# Patient Record
Sex: Female | Born: 1955 | Race: White | Hispanic: No | Smoking: Current every day smoker
Health system: Southern US, Community
[De-identification: ages and names within clinical notes are randomized; demographics above are authoritative.]

## PROBLEM LIST (undated history)

## (undated) DIAGNOSIS — G5600 Carpal tunnel syndrome, unspecified upper limb: Secondary | ICD-10-CM

## (undated) DIAGNOSIS — E7800 Pure hypercholesterolemia, unspecified: Secondary | ICD-10-CM

## (undated) DIAGNOSIS — M1990 Unspecified osteoarthritis, unspecified site: Secondary | ICD-10-CM

## (undated) DIAGNOSIS — E559 Vitamin D deficiency, unspecified: Secondary | ICD-10-CM

## (undated) DIAGNOSIS — K760 Fatty (change of) liver, not elsewhere classified: Secondary | ICD-10-CM

## (undated) DIAGNOSIS — M719 Bursopathy, unspecified: Secondary | ICD-10-CM

## (undated) DIAGNOSIS — F32A Depression, unspecified: Secondary | ICD-10-CM

## (undated) DIAGNOSIS — F419 Anxiety disorder, unspecified: Secondary | ICD-10-CM

## (undated) DIAGNOSIS — Z8619 Personal history of other infectious and parasitic diseases: Secondary | ICD-10-CM

## (undated) DIAGNOSIS — K649 Unspecified hemorrhoids: Secondary | ICD-10-CM

## (undated) DIAGNOSIS — M6530 Trigger finger, unspecified finger: Secondary | ICD-10-CM

## (undated) DIAGNOSIS — T7840XA Allergy, unspecified, initial encounter: Secondary | ICD-10-CM

## (undated) DIAGNOSIS — J309 Allergic rhinitis, unspecified: Secondary | ICD-10-CM

## (undated) DIAGNOSIS — N809 Endometriosis, unspecified: Secondary | ICD-10-CM

## (undated) DIAGNOSIS — M810 Age-related osteoporosis without current pathological fracture: Secondary | ICD-10-CM

## (undated) DIAGNOSIS — G4730 Sleep apnea, unspecified: Secondary | ICD-10-CM

## (undated) DIAGNOSIS — Z87442 Personal history of urinary calculi: Secondary | ICD-10-CM

## (undated) DIAGNOSIS — I10 Essential (primary) hypertension: Secondary | ICD-10-CM

## (undated) DIAGNOSIS — F1010 Alcohol abuse, uncomplicated: Secondary | ICD-10-CM

## (undated) DIAGNOSIS — J329 Chronic sinusitis, unspecified: Secondary | ICD-10-CM

## (undated) HISTORY — PX: BREAST BIOPSY: SHX20

## (undated) HISTORY — PX: OTHER SURGICAL HISTORY: SHX169

## (undated) HISTORY — PX: COLONOSCOPY: SHX174

## (undated) HISTORY — PX: ABDOMINAL HYSTERECTOMY: SHX81

## (undated) HISTORY — PX: TONSILLECTOMY AND ADENOIDECTOMY: SUR1326

---
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
## 1996-09-27 HISTORY — PX: ABDOMINAL HYSTERECTOMY: SHX81

## 2004-10-09 ENCOUNTER — Ambulatory Visit: Payer: Self-pay | Admitting: Family Medicine

## 2005-09-27 DIAGNOSIS — H25019 Cortical age-related cataract, unspecified eye: Secondary | ICD-10-CM

## 2005-09-27 HISTORY — DX: Cortical age-related cataract, unspecified eye: H25.019

## 2005-11-11 ENCOUNTER — Ambulatory Visit: Payer: Self-pay | Admitting: Family Medicine

## 2005-11-19 ENCOUNTER — Ambulatory Visit: Payer: Self-pay | Admitting: Family Medicine

## 2006-01-03 ENCOUNTER — Ambulatory Visit: Payer: Self-pay | Admitting: General Surgery

## 2006-02-03 ENCOUNTER — Ambulatory Visit: Payer: Self-pay | Admitting: Gastroenterology

## 2007-05-10 ENCOUNTER — Ambulatory Visit: Payer: Self-pay

## 2008-06-06 ENCOUNTER — Ambulatory Visit: Payer: Self-pay | Admitting: Family Medicine

## 2009-06-09 ENCOUNTER — Ambulatory Visit: Payer: Self-pay | Admitting: Family Medicine

## 2010-07-21 ENCOUNTER — Ambulatory Visit: Payer: Self-pay | Admitting: Family Medicine

## 2010-07-28 ENCOUNTER — Ambulatory Visit: Payer: Self-pay | Admitting: Unknown Physician Specialty

## 2010-08-05 ENCOUNTER — Ambulatory Visit: Payer: Self-pay | Admitting: Family Medicine

## 2011-08-18 ENCOUNTER — Ambulatory Visit: Payer: Self-pay | Admitting: Family Medicine

## 2011-08-29 ENCOUNTER — Ambulatory Visit: Payer: Self-pay | Admitting: Internal Medicine

## 2011-10-17 ENCOUNTER — Ambulatory Visit: Payer: Self-pay

## 2012-08-22 ENCOUNTER — Ambulatory Visit: Payer: Self-pay | Admitting: Family Medicine

## 2012-08-29 ENCOUNTER — Ambulatory Visit: Payer: Self-pay | Admitting: Family Medicine

## 2012-09-12 ENCOUNTER — Ambulatory Visit: Payer: Self-pay | Admitting: Surgery

## 2013-09-07 ENCOUNTER — Ambulatory Visit: Payer: Self-pay | Admitting: Family Medicine

## 2013-11-23 IMAGING — CR DG CHEST 2V
1 series · 2 of 2 positions shown · non-contrast
Comparison: none

REASON FOR EXAM: cough
COMMENTS:

PROCEDURE:     MDR - MDR CHEST PA(OR AP) AND LATERAL  - October 17, 2011 [DATE]
RESULT:     The lungs are clear. The cardiac silhouette and visualized bony
skeleton are unremarkable.

[Series 1: pa · 0.17mm/px · 2 of 2 slices shown]
[im 1/2]
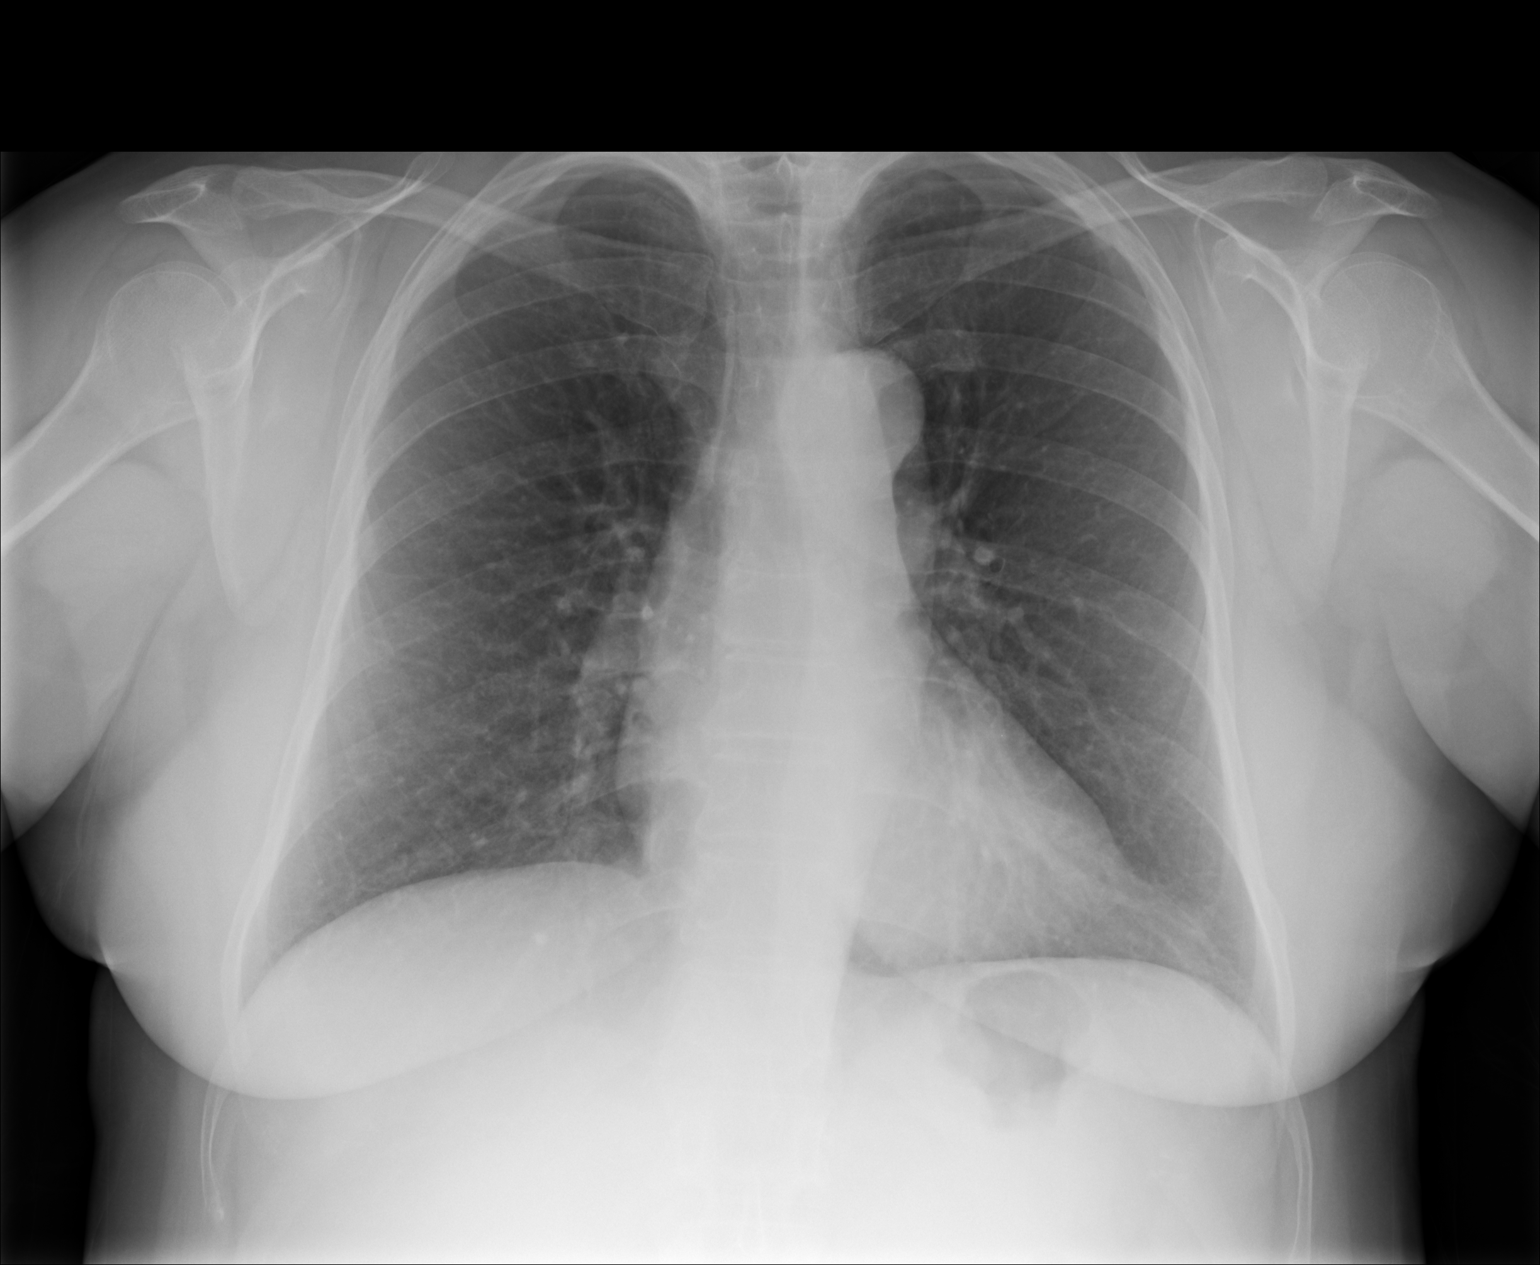
[im 2/2]
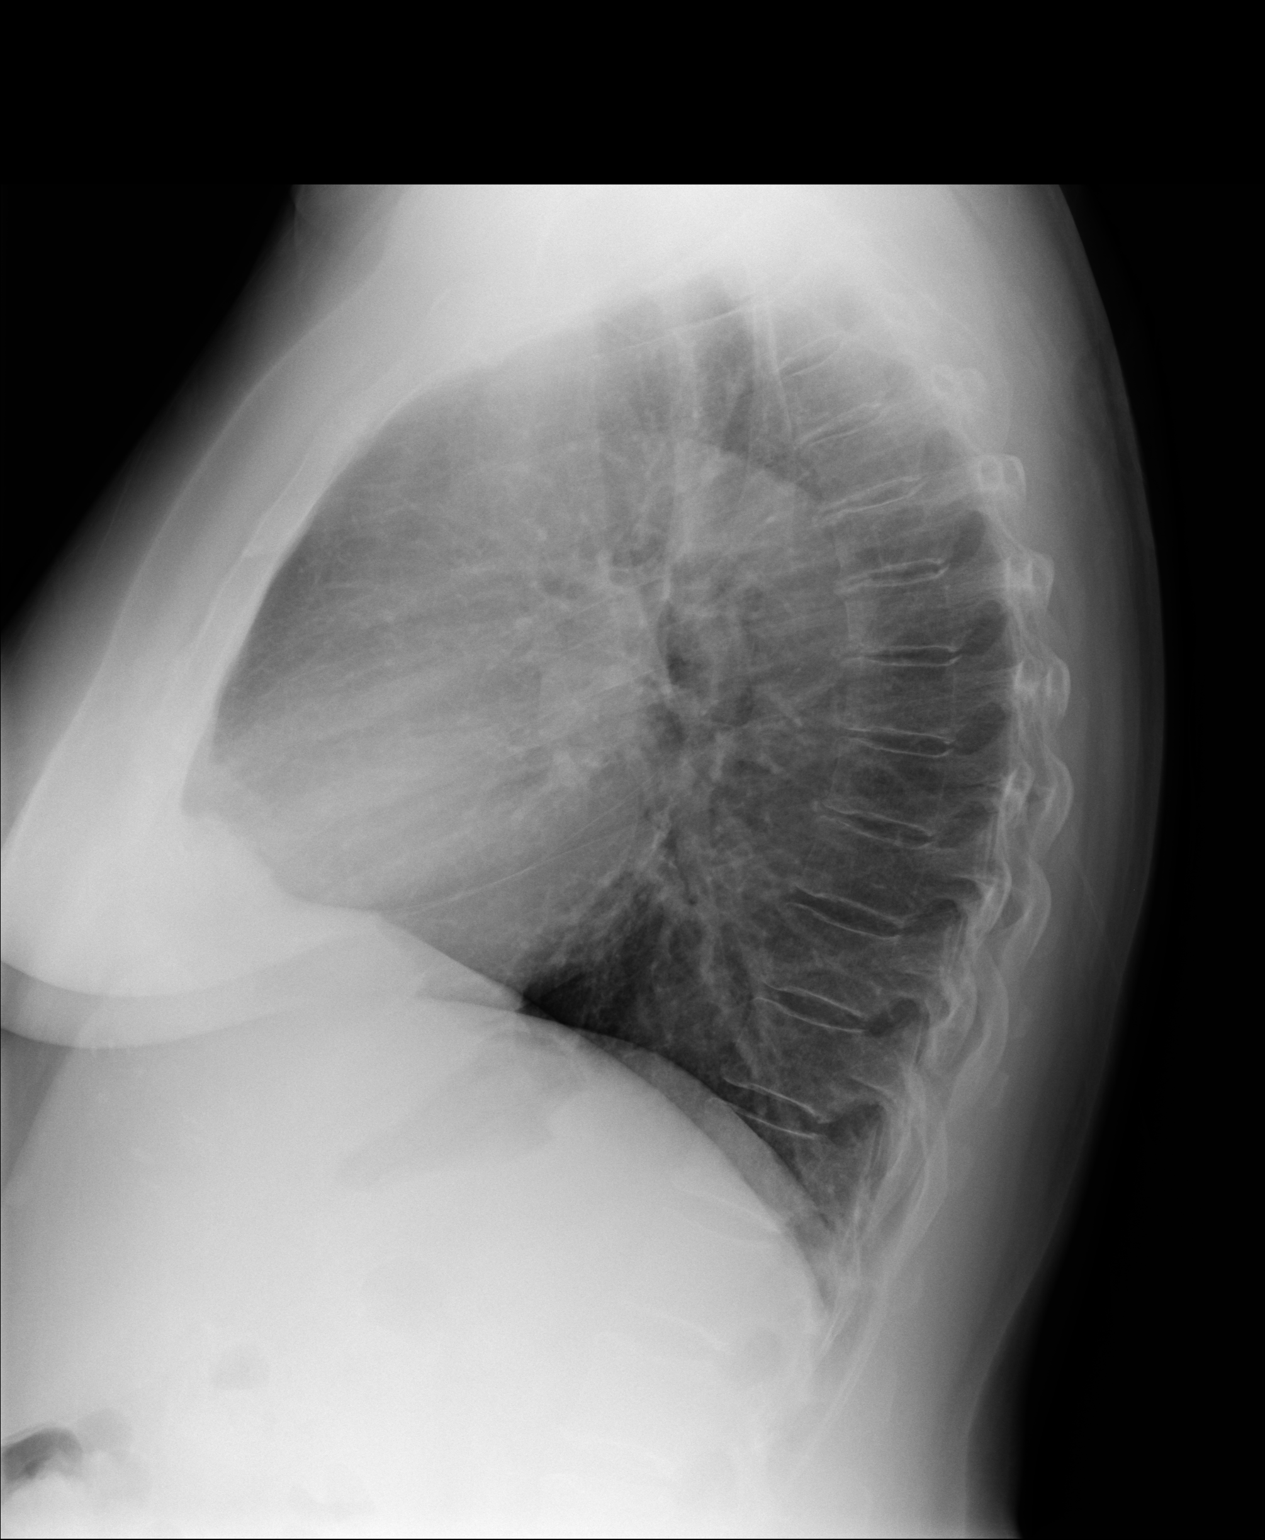

[2 of 2 positions shown; findings below may reference images not displayed]

IMPRESSION: 1. Chest radiograph without evidence of acute cardiopulmonary disease.
2. Comparison made to prior study dated 08/29/2011.

## 2015-06-04 ENCOUNTER — Ambulatory Visit: Payer: BLUE CROSS/BLUE SHIELD

## 2015-06-04 ENCOUNTER — Ambulatory Visit
Admission: EM | Admit: 2015-06-04 | Discharge: 2015-06-04 | Disposition: A | Discharge status: Home / Self Care | Payer: BLUE CROSS/BLUE SHIELD | Attending: Emergency Medicine | Admitting: Emergency Medicine

## 2015-06-04 ENCOUNTER — Encounter: Payer: Self-pay | Admitting: Emergency Medicine

## 2015-06-04 DIAGNOSIS — J40 Bronchitis, not specified as acute or chronic: Secondary | ICD-10-CM

## 2015-06-04 DIAGNOSIS — J0100 Acute maxillary sinusitis, unspecified: Secondary | ICD-10-CM

## 2015-06-04 HISTORY — DX: Essential (primary) hypertension: I10

## 2015-06-04 HISTORY — DX: Anxiety disorder, unspecified: F41.9

## 2015-06-04 HISTORY — DX: Unspecified osteoarthritis, unspecified site: M19.90

## 2015-06-04 MED ORDER — DOXYCYCLINE HYCLATE 100 MG PO CAPS: 100.0000 mg | ORAL_CAPSULE | Freq: Two times a day (BID) | ORAL | Status: AC

## 2015-06-04 MED ORDER — BENZONATATE 100 MG PO CAPS: 100.0000 mg | ORAL_CAPSULE | Freq: Three times a day (TID) | ORAL | Status: AC | PRN

## 2015-06-04 NOTE — Discharge Instructions (Signed)
Take medication as prescribed. Rest. Drink plenty of water.   Follow up with your primary care physician this week. Return to Urgent care for new or worsening concerns.   Sinusitis Sinusitis is redness, soreness, and inflammation of the paranasal sinuses. Paranasal sinuses are air pockets within the bones of your face (beneath the eyes, the middle of the forehead, or above the eyes). In healthy paranasal sinuses, mucus is able to drain out, and air is able to circulate through them by way of your nose. However, when your paranasal sinuses are inflamed, mucus and air can become trapped. This can allow bacteria and other germs to grow and cause infection. Sinusitis can develop quickly and last only a short time (acute) or continue over a long period (chronic). Sinusitis that lasts for more than 12 weeks is considered chronic.  CAUSES  Causes of sinusitis include:  Allergies.  Structural abnormalities, such as displacement of the cartilage that separates your nostrils (deviated septum), which can decrease the air flow through your nose and sinuses and affect sinus drainage.  Functional abnormalities, such as when the small hairs (cilia) that line your sinuses and help remove mucus do not work properly or are not present. SIGNS AND SYMPTOMS  Symptoms of acute and chronic sinusitis are the same. The primary symptoms are pain and pressure around the affected sinuses. Other symptoms include:  Upper toothache.  Earache.  Headache.  Bad breath.  Decreased sense of smell and taste.  A cough, which worsens when you are lying flat.  Fatigue.  Fever.  Thick drainage from your nose, which often is green and may contain pus (purulent).  Swelling and warmth over the affected sinuses. DIAGNOSIS  Your health care provider will perform a physical exam. During the exam, your health care provider may:  Look in your nose for signs of abnormal growths in your nostrils (nasal polyps).  Tap over the  affected sinus to check for signs of infection.  View the inside of your sinuses (endoscopy) using an imaging device that has a light attached (endoscope). If your health care provider suspects that you have chronic sinusitis, one or more of the following tests may be recommended:  Allergy tests.  Nasal culture. A sample of mucus is taken from your nose, sent to a lab, and screened for bacteria.  Nasal cytology. A sample of mucus is taken from your nose and examined by your health care provider to determine if your sinusitis is related to an allergy. TREATMENT  Most cases of acute sinusitis are related to a viral infection and will resolve on their own within 10 days. Sometimes medicines are prescribed to help relieve symptoms (pain medicine, decongestants, nasal steroid sprays, or saline sprays).  However, for sinusitis related to a bacterial infection, your health care provider will prescribe antibiotic medicines. These are medicines that will help kill the bacteria causing the infection.  Rarely, sinusitis is caused by a fungal infection. In theses cases, your health care provider will prescribe antifungal medicine. For some cases of chronic sinusitis, surgery is needed. Generally, these are cases in which sinusitis recurs more than 3 times per year, despite other treatments. HOME CARE INSTRUCTIONS   Drink plenty of water. Water helps thin the mucus so your sinuses can drain more easily.  Use a humidifier.  Inhale steam 3 to 4 times a day (for example, sit in the bathroom with the shower running).  Apply a warm, moist washcloth to your face 3 to 4 times a day, or  as directed by your health care provider.  Use saline nasal sprays to help moisten and clean your sinuses.  Take medicines only as directed by your health care provider.  If you were prescribed either an antibiotic or antifungal medicine, finish it all even if you start to feel better. SEEK IMMEDIATE MEDICAL CARE IF:  You  have increasing pain or severe headaches.  You have nausea, vomiting, or drowsiness.  You have swelling around your face.  You have vision problems.  You have a stiff neck.  You have difficulty breathing. MAKE SURE YOU:   Understand these instructions.  Will watch your condition.  Will get help right away if you are not doing well or get worse. Document Released: 09/13/2005 Document Revised: 01/28/2014 Document Reviewed: 09/28/2011 [facility] Patient Information 2015 [place], [place]. This information is not intended to replace advice given to you by your health care provider. Make sure you discuss any questions you have with your health care provider.

## 2015-06-04 NOTE — ED Provider Notes (Signed)
[facility] Emergency Department Provider Note  ____________________________________________  Time seen: Approximately 8:00 AM  I have reviewed the triage vital signs and the nursing notes.   HISTORY  Chief Complaint Facial Pain   HPI Linda Herrera is a 59 y.o. female presents with complaints of runny nose, cough, congestion and sinus pressure 2 weeks. States that she has seasonal allergies and has had a runny nose since August but states about a similar to her normal period since the last 2 weeks she has had increased congestion and sinus pressure. States in the last 3-4 days she can fill the sinus drainage going into her chest. States causing increased cough. States cough is worse lying down with postnasal drip. States occasional wheezing with cough, none today. Denies constant wheezing. Denies chest pain or shortness of breath. Denies fevers. Reports continues to eat and drink well. States sinus pressure pain is 4 out of 10 described as pressure.  Denies chest pain, shortness of breath, abdominal pain, fever, neck pain, back pain or other complaints. States she was treated by her primary care physician 3-4 weeks ago for sinus infection with oral amoxicillin  for 10 days. States that this did not help.    Past Medical History  Diagnosis Date  . Arthritis   . Anxiety   . Hypertension     There are no active problems to display for this patient.   Past Surgical History  Procedure Laterality Date  . Abdominal hysterectomy      Current Outpatient Rx  Name  Route  Sig  Dispense  Refill  . alendronate (FOSAMAX) 70 MG tablet   Oral   Take 70 mg by mouth once a week. Take with a full glass of water on an empty stomach.         Marland Kitchen aspirin EC 81 MG tablet   Oral   Take 81 mg by mouth daily.         . calcium carbonate 1250 MG capsule   Oral   Take 1,250 mg by mouth 2 (two) times daily with a meal.         . diclofenac sodium (VOLTAREN) 1 %  GEL   Topical   Apply topically 4 (four) times daily.         Marland Kitchen FLUoxetine (PROZAC) 10 MG capsule   Oral   Take 10 mg by mouth daily.         Marland Kitchen HYDROcodone-acetaminophen (NORCO/VICODIN) 5-325 MG per tablet   Oral   Take 1 tablet by mouth every 6 (six) hours as needed for moderate pain.         Marland Kitchen lisinopril (PRINIVIL,ZESTRIL) 20 MG tablet   Oral   Take 20 mg by mouth daily.           Allergies Flonase; Ciprofloxacin; Ibuprofen; Nsaids; Benadryl; Codeine; and Erythromycin  History reviewed. No pertinent family history.  Social History Social History  Substance Use Topics  . Smoking status: Current Every Day Smoker  . Smokeless tobacco: None  . Alcohol Use: No    Review of Systems Constitutional: No fever/chills Eyes: No visual changes. ENT: No sore throat. Runny nose, congestion and sinus pressure.  Cardiovascular: Denies chest pain. Respiratory: Denies shortness of breath. Intermittent cough. Gastrointestinal: No abdominal pain.  No nausea, no vomiting.  No diarrhea.  No constipation. Genitourinary: Negative for dysuria. Musculoskeletal: Negative for back pain. Skin: Negative for rash. Neurological: Negative for headaches, focal weakness or numbness.  10-point ROS otherwise negative.  ____________________________________________   PHYSICAL EXAM:  VITAL SIGNS: ED Triage Vitals  Enc Vitals Group     BP 06/04/15 0737 131/88 mmHg     Pulse Rate 06/04/15 0737 103     Resp 06/04/15 0737 20     Temp 06/04/15 0737 98.5 F (36.9 C)     Temp Source 06/04/15 0737 Tympanic     SpO2 06/04/15 0737 98 %     Weight 06/04/15 0737 184 lb (83.462 kg)     Height 06/04/15 0737 5' 4.5" (1.638 m)     Head Cir --      Peak Flow --      Pain Score 06/04/15 0748 8     Pain Loc --      Pain Edu? --      Excl. in [place]? --    Today's Vitals   06/04/15 0737 06/04/15 0748 06/04/15 0844  BP: 131/88  132/73  Pulse: 103  93  Temp: 98.5 F (36.9 C)    TempSrc: Tympanic     Resp: 20  16  Height: 5' 4.5" (1.638 m)    Weight: 184 lb (83.462 kg)    SpO2: 98%    PainSc:  8     Constitutional: Alert and oriented. Well appearing and in no acute distress. Eyes: Conjunctivae are normal. PERRL. EOMI. Head: Atraumatic.Mild to mod Maxillary sinus TTP, mild frontal sinus TTP.   Ears: no erythema, normal TMs bilaterally.   Nose:mild clear rhinorrhea, with bilateral turbinate edema.   Mouth/Throat: Mucous membranes are moist.  Oropharynx non-erythematous. Neck: No stridor.  No cervical spine tenderness to palpation. Hematological/Lymphatic/Immunilogical: No cervical lymphadenopathy. Cardiovascular: Normal rate, regular rhythm. Grossly normal heart sounds.  Good peripheral circulation. Respiratory: Normal respiratory effort.  No retractions.No wheezes or rales. Scattered base rhonchi. Dry intermittent cough.  Gastrointestinal: Soft and nontender. No distention. Normal Bowel sounds.  No abdominal bruits. No CVA tenderness. Musculoskeletal: No lower or upper extremity tenderness nor edema.  No joint effusions. Bilateral pedal pulses equal and easily palpated.  Neurologic:  Normal speech and language. No gross focal neurologic deficits are appreciated. No gait instability. Skin:  Skin is warm, dry and intact. No rash noted. Psychiatric: Mood and affect are normal. Speech and behavior are normal.  ____________________________________________   LABS (all labs ordered are listed, but only abnormal results are displayed)  Labs Reviewed - No data to display  RADIOLOGY  EXAM: CHEST 2 VIEW  COMPARISON: October 17, 2011.  FINDINGS: The heart size and mediastinal contours are within normal limits. Both lungs are clear. No pneumothorax or pleural effusion is noted. The visualized skeletal structures are unremarkable.  IMPRESSION: No active cardiopulmonary disease.   Electronically Signed By: Marijo Conception, M.D. On: 06/04/2015 08:23  I, Marylene Land,  personally viewed and evaluated these images (plain radiographs) as part of my medical decision making.   ________________________________________   INITIAL IMPRESSION / ASSESSMENT AND PLAN / ED COURSE  Pertinent labs & imaging results that were available during my care of the patient were reviewed by me and considered in my medical decision making (see chart for details).  Well-appearing patient. No acute distress. Presents with complaints of approximately 2 weeks of runny nose, congestion, sinus pressure and cough. States started in sinuses and now draining back of throat into chest. States intermittent cough. Denies chest pain or shortness of breath. Denies fever. Reports continues to eat and drink well. Will evaluate chest x-ray. Suspect maxillary sinusitis and bronchitis.    Chest x-ray negative  for acute cardiopulmonary disease. Patient very well appearing no acute distress. Scattered rhonchi without wheezes or rales. Good air movement. Moist mucous membranes. Will treat patient sinusitis and bronchitis with oral doxycycline and prn tessalon perles as patient reports she has already taken Amoxicillin (and unclear if amoxicillin or augmentin) without relief and she is allergic to ciprofloxacin and erythromycin. Counseled regarding photosensitivity with oral doxycycline. Encouraged rest and fluids. Discussed strict follow-up and return parameters. Discussed the follow up with her primary care physician this week. Patient verbalized understanding and agreed to plan. ____________________________________________   FINAL CLINICAL IMPRESSION(S) / ED DIAGNOSES  Final diagnoses:  Acute maxillary sinusitis, recurrence not specified  Bronchitis       Marylene Land, NP 06/04/15 0936  Marylene Land, NP 06/04/15 [phone]

## 2015-06-04 NOTE — ED Notes (Signed)
Sinus drainage and tightness xdays

## 2020-02-05 ENCOUNTER — Other Ambulatory Visit: Payer: Self-pay | Admitting: Family Medicine

## 2020-02-05 DIAGNOSIS — R748 Abnormal levels of other serum enzymes: Secondary | ICD-10-CM

## 2020-02-15 ENCOUNTER — Ambulatory Visit
Admission: RE | Admit: 2020-02-15 | Discharge: 2020-02-15 | Disposition: A | Discharge status: Home / Self Care | Payer: Medicare Other | Source: Ambulatory Visit | Attending: Family Medicine | Admitting: Family Medicine

## 2020-02-15 ENCOUNTER — Other Ambulatory Visit: Payer: Self-pay

## 2020-02-15 DIAGNOSIS — R748 Abnormal levels of other serum enzymes: Secondary | ICD-10-CM | POA: Diagnosis present

## 2021-01-22 ENCOUNTER — Other Ambulatory Visit: Admission: RE | Admit: 2021-01-22 | Payer: Medicare Other | Source: Ambulatory Visit

## 2021-01-23 ENCOUNTER — Encounter: Payer: Self-pay | Admitting: *Deleted

## 2021-01-26 ENCOUNTER — Ambulatory Visit
Admission: RE | Admit: 2021-01-26 | Discharge: 2021-01-26 | Disposition: A | Discharge status: Home / Self Care | Payer: Medicare Other | Attending: Gastroenterology | Admitting: Gastroenterology

## 2021-01-26 ENCOUNTER — Ambulatory Visit: Payer: Medicare Other | Admitting: Anesthesiology

## 2021-01-26 ENCOUNTER — Encounter
Admission: RE | Disposition: A | Discharge status: Home / Self Care | Payer: Self-pay | Source: Home / Self Care | Attending: Gastroenterology

## 2021-01-26 ENCOUNTER — Encounter: Payer: Self-pay | Admitting: *Deleted

## 2021-01-26 DIAGNOSIS — Z7952 Long term (current) use of systemic steroids: Secondary | ICD-10-CM | POA: Insufficient documentation

## 2021-01-26 DIAGNOSIS — Z79899 Other long term (current) drug therapy: Secondary | ICD-10-CM | POA: Insufficient documentation

## 2021-01-26 DIAGNOSIS — K5730 Diverticulosis of large intestine without perforation or abscess without bleeding: Secondary | ICD-10-CM | POA: Diagnosis not present

## 2021-01-26 DIAGNOSIS — D120 Benign neoplasm of cecum: Secondary | ICD-10-CM | POA: Diagnosis not present

## 2021-01-26 DIAGNOSIS — Z7982 Long term (current) use of aspirin: Secondary | ICD-10-CM | POA: Diagnosis not present

## 2021-01-26 DIAGNOSIS — Z1211 Encounter for screening for malignant neoplasm of colon: Secondary | ICD-10-CM | POA: Diagnosis present

## 2021-01-26 DIAGNOSIS — D123 Benign neoplasm of transverse colon: Secondary | ICD-10-CM | POA: Insufficient documentation

## 2021-01-26 DIAGNOSIS — Z791 Long term (current) use of non-steroidal anti-inflammatories (NSAID): Secondary | ICD-10-CM | POA: Diagnosis not present

## 2021-01-26 DIAGNOSIS — Z881 Allergy status to other antibiotic agents status: Secondary | ICD-10-CM | POA: Diagnosis not present

## 2021-01-26 DIAGNOSIS — Z8371 Family history of colonic polyps: Secondary | ICD-10-CM | POA: Diagnosis not present

## 2021-01-26 DIAGNOSIS — Z888 Allergy status to other drugs, medicaments and biological substances status: Secondary | ICD-10-CM | POA: Insufficient documentation

## 2021-01-26 DIAGNOSIS — Z800 Family history of malignant neoplasm of digestive organs: Secondary | ICD-10-CM | POA: Diagnosis not present

## 2021-01-26 DIAGNOSIS — K640 First degree hemorrhoids: Secondary | ICD-10-CM | POA: Insufficient documentation

## 2021-01-26 DIAGNOSIS — Z885 Allergy status to narcotic agent status: Secondary | ICD-10-CM | POA: Insufficient documentation

## 2021-01-26 DIAGNOSIS — Z886 Allergy status to analgesic agent status: Secondary | ICD-10-CM | POA: Insufficient documentation

## 2021-01-26 HISTORY — DX: Allergic rhinitis, unspecified: J30.9

## 2021-01-26 HISTORY — DX: Vitamin D deficiency, unspecified: E55.9

## 2021-01-26 HISTORY — DX: Unspecified hemorrhoids: K64.9

## 2021-01-26 HISTORY — DX: Personal history of other infectious and parasitic diseases: Z86.19

## 2021-01-26 HISTORY — DX: Allergy, unspecified, initial encounter: T78.40XA

## 2021-01-26 HISTORY — DX: Carpal tunnel syndrome, unspecified upper limb: G56.00

## 2021-01-26 HISTORY — DX: Sleep apnea, unspecified: G47.30

## 2021-01-26 HISTORY — DX: Alcohol abuse, uncomplicated: F10.10

## 2021-01-26 HISTORY — DX: Depression, unspecified: F32.A

## 2021-01-26 HISTORY — DX: Pure hypercholesterolemia, unspecified: E78.00

## 2021-01-26 HISTORY — DX: Endometriosis, unspecified: N80.9

## 2021-01-26 HISTORY — DX: Chronic sinusitis, unspecified: J32.9

## 2021-01-26 HISTORY — PX: COLONOSCOPY WITH PROPOFOL: SHX5780

## 2021-01-26 HISTORY — DX: Bursopathy, unspecified: M71.9

## 2021-01-26 HISTORY — DX: Trigger finger, unspecified finger: M65.30

## 2021-01-26 HISTORY — DX: Fatty (change of) liver, not elsewhere classified: K76.0

## 2021-01-26 HISTORY — DX: Age-related osteoporosis without current pathological fracture: M81.0

## 2021-01-26 HISTORY — DX: Personal history of urinary calculi: Z87.442

## 2021-01-26 SURGERY — COLONOSCOPY WITH PROPOFOL
Anesthesia: General

## 2021-01-26 MED ORDER — FENTANYL CITRATE (PF) 100 MCG/2ML IJ SOLN
INTRAMUSCULAR | Status: DC | PRN
  Administered 2021-01-26: 50 ug via INTRAVENOUS
  Administered 2021-01-26 (×2): 25 ug via INTRAVENOUS

## 2021-01-26 MED ORDER — SODIUM CHLORIDE 0.9 % IV SOLN: INTRAVENOUS | Status: DC

## 2021-01-26 MED ORDER — PROPOFOL 500 MG/50ML IV EMUL
INTRAVENOUS | Status: DC | PRN
  Administered 2021-01-26: 50 ug/kg/min via INTRAVENOUS

## 2021-01-26 MED ORDER — LIDOCAINE HCL (CARDIAC) PF 100 MG/5ML IV SOSY
PREFILLED_SYRINGE | INTRAVENOUS | Status: DC | PRN
  Administered 2021-01-26: 60 mg via INTRAVENOUS

## 2021-01-26 MED ORDER — PROPOFOL 10 MG/ML IV BOLUS
INTRAVENOUS | Status: DC | PRN
  Administered 2021-01-26: 10 mg via INTRAVENOUS
  Administered 2021-01-26: 20 mg via INTRAVENOUS
  Administered 2021-01-26: 10 mg via INTRAVENOUS

## 2021-01-26 MED ORDER — MIDAZOLAM HCL 2 MG/2ML IJ SOLN
INTRAMUSCULAR | Status: AC
  Filled 2021-01-26: qty 2

## 2021-01-26 MED ORDER — MIDAZOLAM HCL 2 MG/2ML IJ SOLN
INTRAMUSCULAR | Status: DC | PRN
  Administered 2021-01-26: 2 mg via INTRAVENOUS

## 2021-01-26 MED ORDER — FENTANYL CITRATE (PF) 100 MCG/2ML IJ SOLN
INTRAMUSCULAR | Status: AC
  Filled 2021-01-26: qty 2

## 2021-01-26 NOTE — H&P (Signed)
Outpatient short stay form Pre-procedure 01/26/2021 10:10 AM Raylene Miyamoto MD, MPH  Primary Physician: Dr. Ellison Hughs  Reason for visit:  Screening  History of present illness:   65 y/o lady with family history of colon cancer in her brother who was just diagnosed at 30 which required surgery and colostomy bag. Aunt with colon cancer. Her last colonoscopy was > 10 years ago. No blood thinners. History of hysterectomy. No new symptoms such as chest pain or shortness of breath.    Current Facility-Administered Medications:  .  0.9 %  sodium chloride infusion, , Intravenous, Continuous, Braelynn Lupton, Hilton Cork, MD, Last Rate: 20 mL/hr at 01/26/21 0955, New Bag at 01/26/21 0955  Medications Prior to Admission  Medication Sig Dispense Refill Last Dose  . acetaminophen (TYLENOL) 500 MG tablet Take 500 mg by mouth daily as needed.   Past Month at Unknown time  . calcium carbonate 1250 MG capsule Take 1,250 mg by mouth 2 (two) times daily with a meal.   01/25/2021  . diclofenac sodium (VOLTAREN) 1 % GEL Apply topically 4 (four) times daily.   Past Month at Unknown time  . diphenhydrAMINE (BENADRYL) 25 MG tablet Take 25 mg by mouth 3 (three) times a week.   01/25/2021  . lisinopril (PRINIVIL,ZESTRIL) 20 MG tablet Take 20 mg by mouth daily.   01/26/2021 at Unknown time  . Multiple Vitamin (MULTIVITAMIN) tablet Take 1 tablet by mouth daily.   01/25/2021 at Unknown time  . pseudoephedrine (SUDAFED) 30 MG tablet Take 30 mg by mouth every other day.   01/25/2021  . alendronate (FOSAMAX) 70 MG tablet Take 70 mg by mouth once a week. Take with a full glass of water on an empty stomach.   01/23/2021  . aspirin EC 81 MG tablet Take 81 mg by mouth daily.   01/24/2021  . benzonatate (TESSALON PERLES) 100 MG capsule Take 1 capsule (100 mg total) by mouth 3 (three) times daily as needed for cough. (Patient not taking: Reported on 01/26/2021) 15 capsule 0 Not Taking at Unknown time  . DOCOSAHEXAENOIC ACID-EPA PO Take by mouth  daily. (Patient not taking: Reported on 01/26/2021)   Not Taking at Unknown time  . doxycycline (VIBRAMYCIN) 100 MG capsule Take 1 capsule (100 mg total) by mouth 2 (two) times daily. (Patient not taking: Reported on 01/26/2021) 20 capsule 0 Not Taking at Unknown time  . FLUoxetine (PROZAC) 10 MG capsule Take 10 mg by mouth daily. (Patient not taking: Reported on 01/26/2021)   Not Taking at Unknown time  . HYDROcodone-acetaminophen (NORCO/VICODIN) 5-325 MG per tablet Take 1 tablet by mouth every 6 (six) hours as needed for moderate pain. (Patient not taking: Reported on 01/26/2021)   Not Taking at Unknown time  . predniSONE (DELTASONE) 10 MG tablet Take 10 mg by mouth daily with breakfast. (Patient not taking: Reported on 01/26/2021)   Not Taking at Unknown time     Allergies  Allergen Reactions  . Flonase [Fluticasone Propionate] Shortness Of Breath  . Flonase [Fluticasone] Shortness Of Breath  . Ciprofloxacin Swelling  . Dextromethorphan Nausea Only  . Ibuprofen Hives  . Nsaids Other (See Comments)    hallucination  . Benadryl [Diphenhydramine] Anxiety  . Codeine Anxiety  . Erythromycin Rash     Past Medical History:  Diagnosis Date  . Alcohol abuse   . Allergic rhinitis   . Allergy   . Anxiety   . Arthritis   . Bursitis   . Carpal tunnel syndrome   .  Cataract cortical, senile 2007  . Depression   . Endometriosis   . Hemorrhoids   . History of chicken pox   . History of kidney stones   . Hypertension   . Osteoporosis   . Pure hypercholesterolemia   . Recurrent sinusitis   . Sleep apnea   . Trigger finger   . Vitamin D deficiency     Review of systems:  Otherwise negative.    Physical Exam  Gen: Alert, oriented. Appears stated age.  HEENT: PERRLA. Lungs: No respiratory distress CV: RRR Abd: soft, benign, no masses Ext: No edema    Planned procedures: Proceed with colonoscopy. The patient understands the nature of the planned procedure, indications, risks,  alternatives and potential complications including but not limited to bleeding, infection, perforation, damage to internal organs and possible oversedation/side effects from anesthesia. The patient agrees and gives consent to proceed.  Please refer to procedure notes for findings, recommendations and patient disposition/instructions.     Raylene Miyamoto MD, MPH Gastroenterology 01/26/2021  10:10 AM

## 2021-01-26 NOTE — Anesthesia Preprocedure Evaluation (Signed)
Anesthesia Evaluation  Patient identified by MRN, date of birth, ID band Patient awake    Reviewed: Allergy & Precautions, H&P , NPO status , Patient's Chart, lab work & pertinent test results  History of Anesthesia Complications (+) history of anesthetic complications ("requires small mask")  Airway Mallampati: III  TM Distance: <3 FB Neck ROM: limited    Dental  (+) Chipped, Poor Dentition, Missing   Pulmonary shortness of breath and with exertion, sleep apnea , COPD, Current Smoker and Patient abstained from smoking.,    Pulmonary exam normal        Cardiovascular hypertension, (-) angina(-) Past MI Normal cardiovascular exam     Neuro/Psych PSYCHIATRIC DISORDERS  Neuromuscular disease    GI/Hepatic negative GI ROS, Neg liver ROS, neg GERD  ,  Endo/Other  negative endocrine ROS  Renal/GU negative Renal ROS  negative genitourinary   Musculoskeletal  (+) Arthritis ,   Abdominal   Peds  Hematology negative hematology ROS (+)   Anesthesia Other Findings Past Medical History: No date: Alcohol abuse No date: Allergic rhinitis No date: Allergy No date: Anxiety No date: Arthritis No date: Bursitis No date: Carpal tunnel syndrome 2007: Cataract cortical, senile No date: Depression No date: Endometriosis No date: Hemorrhoids No date: History of chicken pox No date: History of kidney stones No date: Hypertension No date: Osteoporosis No date: Pure hypercholesterolemia No date: Recurrent sinusitis No date: Sleep apnea No date: Trigger finger No date: Vitamin D deficiency  Past Surgical History: 1998: ABDOMINAL HYSTERECTOMY No date: COLONOSCOPY No date: endoscopic carpal tunnel release No date: kidney stone removal No date: knee tear No date: Septal surgery 1962: TONSILLECTOMY No date: TONSILLECTOMY AND ADENOIDECTOMY  BMI    Body Mass Index: 29.95 kg/m      Reproductive/Obstetrics negative OB  ROS                             Anesthesia Physical Anesthesia Plan  ASA: III  Anesthesia Plan: General   Post-op Pain Management:    Induction: Intravenous  PONV Risk Score and Plan: Propofol infusion and TIVA  Airway Management Planned: Natural Airway and Nasal Cannula  Additional Equipment:   Intra-op Plan:   Post-operative Plan:   Informed Consent: I have reviewed the patients History and Physical, chart, labs and discussed the procedure including the risks, benefits and alternatives for the proposed anesthesia with the patient or authorized representative who has indicated his/her understanding and acceptance.     Dental Advisory Given  Plan Discussed with: Anesthesiologist, CRNA and Surgeon  Anesthesia Plan Comments: (Patient consented for risks of anesthesia including but not limited to:  - adverse reactions to medications - risk of airway placement if required - damage to eyes, teeth, lips or other oral mucosa - nerve damage due to positioning  - sore throat or hoarseness - Damage to heart, brain, nerves, lungs, other parts of body or loss of life  Patient voiced understanding.)        Anesthesia Quick Evaluation

## 2021-01-26 NOTE — Op Note (Signed)
[facility] Patient Name: Linda Herrera Procedure Date: 01/26/2021 10:11 AM MRN: [id number] Account #: [id number] Date of Birth: 1955-11-05 Admit Type: Outpatient Age: 65 Room: [facility] ENDO ROOM 3 Gender: Female Note Status: Finalized Procedure:             Colonoscopy Indications:           Screening in patient at increased risk: Family history                         of 1st-degree relative with colorectal cancer Providers:             Andrey Farmer MD, MD Medicines:             Monitored Anesthesia Care Complications:         No immediate complications. Estimated blood loss:                         Minimal. Procedure:             Pre-Anesthesia Assessment:                        - Prior to the procedure, a History and Physical was                         performed, and patient medications and allergies were                         reviewed. The patient is competent. The risks and                         benefits of the procedure and the sedation options and                         risks were discussed with the patient. All questions                         were answered and informed consent was obtained.                         Patient identification and proposed procedure were                         verified by the physician, the nurse, the anesthetist                         and the technician in the endoscopy suite. Mental                         Status Examination: alert and oriented. Airway                         Examination: normal oropharyngeal airway and neck                         mobility. Respiratory Examination: clear to                         auscultation. CV Examination: normal. Prophylactic  Antibiotics: The patient does not require prophylactic                         antibiotics. Prior Anticoagulants: The patient has                         taken no previous anticoagulant or antiplatelet agents                          except for aspirin. ASA Grade Assessment: II - A                         patient with mild systemic disease. After reviewing                         the risks and benefits, the patient was deemed in                         satisfactory condition to undergo the procedure. The                         anesthesia plan was to use monitored anesthesia care                         (MAC). Immediately prior to administration of                         medications, the patient was re-assessed for adequacy                         to receive sedatives. The heart rate, respiratory                         rate, oxygen saturations, blood pressure, adequacy of                         pulmonary ventilation, and response to care were                         monitored throughout the procedure. The physical                         status of the patient was re-assessed after the                         procedure.                        After obtaining informed consent, the colonoscope was                         passed under direct vision. Throughout the procedure,                         the patient's blood pressure, pulse, and oxygen                         saturations were monitored continuously. The  Colonoscope was introduced through the anus and                         advanced to the the cecum, identified by appendiceal                         orifice and ileocecal valve. The colonoscopy was                         performed without difficulty. The patient tolerated                         the procedure well. The quality of the bowel                         preparation was good. Findings:      The perianal and digital rectal examinations were normal.      A 2 mm polyp was found in the cecum. The polyp was sessile. The polyp       was removed with a jumbo cold forceps. Resection and retrieval were       complete. Estimated blood loss was minimal.      A 2 mm polyp  was found in the transverse colon. The polyp was sessile.       The polyp was removed with a jumbo cold forceps. Resection and retrieval       were complete. Estimated blood loss was minimal.      A single small-mouthed diverticulum was found in the sigmoid colon.      Internal hemorrhoids were found during retroflexion. The hemorrhoids       were Grade I (internal hemorrhoids that do not prolapse).      The exam was otherwise without abnormality on direct and retroflexion       views. Impression:            - One 2 mm polyp in the cecum, removed with a jumbo                         cold forceps. Resected and retrieved.                        - One 2 mm polyp in the transverse colon, removed with                         a jumbo cold forceps. Resected and retrieved.                        - Diverticulosis in the sigmoid colon.                        - Internal hemorrhoids.                        - The examination was otherwise normal on direct and                         retroflexion views. Recommendation:        - Discharge patient to home.                        -  Resume previous diet.                        - Continue present medications.                        - Await pathology results.                        - Repeat colonoscopy in 5 years for surveillance.                        - Return to referring physician as previously                         scheduled. Procedure Code(s):     --- Professional ---                        [phone], Colonoscopy, flexible; with biopsy, single or                         multiple Diagnosis Code(s):     --- Professional ---                        Z80.0, Family history of malignant neoplasm of                         digestive organs                        K63.5, Polyp of colon                        K64.0, First degree hemorrhoids                        K57.30, Diverticulosis of large intestine without                         perforation or abscess  without bleeding CPT copyright 2019 [employer]. All rights reserved. The codes documented in this report are preliminary and upon coder review may  be revised to meet current compliance requirements. Andrey Farmer MD, MD 01/26/2021 10:39:14 AM Number of Addenda: 0 Note Initiated On: 01/26/2021 10:11 AM Scope Withdrawal Time: 0 hours 7 minutes 43 seconds  Total Procedure Duration: 0 hours 14 minutes 58 seconds  Estimated Blood Loss:  Estimated blood loss was minimal.      [facility]

## 2021-01-26 NOTE — Transfer of Care (Signed)
Immediate Anesthesia Transfer of Care Note  Patient: Linda Herrera  Procedure(s) Performed: COLONOSCOPY WITH PROPOFOL (N/A )  Patient Location: PACU  Anesthesia Type:General  Level of Consciousness: sedated  Airway & Oxygen Therapy: Patient Spontanous Breathing  Post-op Assessment: Report given to RN and Post -op Vital signs reviewed and stable  Post vital signs: Reviewed and stable  Last Vitals:  Vitals Value Taken Time  BP 119/76 01/26/21 1039  Temp    Pulse 88 01/26/21 1039  Resp 16 01/26/21 1039  SpO2 96 % 01/26/21 1039  Vitals shown include unvalidated device data.  Last Pain:  Vitals:   01/26/21 0942  TempSrc: Temporal  PainSc: 0-No pain         Complications: No complications documented.

## 2021-01-26 NOTE — Anesthesia Postprocedure Evaluation (Signed)
Anesthesia Post Note  Patient: Linda Herrera  Procedure(s) Performed: COLONOSCOPY WITH PROPOFOL (N/A )  Patient location during evaluation: Endoscopy Anesthesia Type: General Level of consciousness: awake and alert Pain management: pain level controlled Vital Signs Assessment: post-procedure vital signs reviewed and stable Respiratory status: spontaneous breathing, nonlabored ventilation, respiratory function stable and patient connected to nasal cannula oxygen Cardiovascular status: blood pressure returned to baseline and stable Postop Assessment: no apparent nausea or vomiting Anesthetic complications: no   No complications documented.   Last Vitals:  Vitals:   01/26/21 1039 01/26/21 1108  BP:  130/78  Pulse:    Resp:    Temp: (!) 36.2 C   SpO2:      Last Pain:  Vitals:   01/26/21 1108  TempSrc:   PainSc: 0-No pain                 Precious Haws Josanne Boerema

## 2021-01-26 NOTE — Interval H&P Note (Signed)
History and Physical Interval Note:  01/26/2021 10:13 AM  Linda Herrera  has presented today for surgery, with the diagnosis of FH COLON POLYPS.  The various methods of treatment have been discussed with the patient and family. After consideration of risks, benefits and other options for treatment, the patient has consented to  Procedure(s): COLONOSCOPY WITH PROPOFOL (N/A) as a surgical intervention.  The patient's history has been reviewed, patient examined, no change in status, stable for surgery.  I have reviewed the patient's chart and labs.  Questions were answered to the patient's satisfaction.     Lesly Rubenstein  Ok to proceed with colonoscopy

## 2021-01-27 ENCOUNTER — Encounter: Payer: Self-pay | Admitting: Gastroenterology

## 2021-01-27 LAB — SURGICAL PATHOLOGY

## 2022-07-01 ENCOUNTER — Other Ambulatory Visit: Payer: Self-pay | Admitting: Family Medicine

## 2022-07-01 DIAGNOSIS — R928 Other abnormal and inconclusive findings on diagnostic imaging of breast: Secondary | ICD-10-CM

## 2022-07-02 ENCOUNTER — Inpatient Hospital Stay
Admission: RE | Admit: 2022-07-02 | Discharge: 2022-07-02 | Disposition: A | Discharge status: Home / Self Care | Payer: Self-pay | Source: Ambulatory Visit | Attending: *Deleted | Admitting: *Deleted

## 2022-07-02 ENCOUNTER — Other Ambulatory Visit: Payer: Self-pay | Admitting: *Deleted

## 2022-07-02 DIAGNOSIS — Z1231 Encounter for screening mammogram for malignant neoplasm of breast: Secondary | ICD-10-CM

## 2022-07-26 ENCOUNTER — Ambulatory Visit
Admission: RE | Admit: 2022-07-26 | Discharge: 2022-07-26 | Disposition: A | Discharge status: Home / Self Care | Payer: Medicare Other | Source: Ambulatory Visit | Attending: Family Medicine | Admitting: Family Medicine

## 2022-07-26 DIAGNOSIS — R928 Other abnormal and inconclusive findings on diagnostic imaging of breast: Secondary | ICD-10-CM

## 2023-08-29 ENCOUNTER — Other Ambulatory Visit: Payer: Self-pay | Admitting: Family Medicine

## 2023-08-29 DIAGNOSIS — Z1231 Encounter for screening mammogram for malignant neoplasm of breast: Secondary | ICD-10-CM

## 2023-08-31 ENCOUNTER — Ambulatory Visit
Admission: RE | Admit: 2023-08-31 | Discharge: 2023-08-31 | Disposition: A | Discharge status: Home / Self Care | Payer: 59 | Source: Ambulatory Visit | Attending: Family Medicine | Admitting: Family Medicine

## 2023-08-31 DIAGNOSIS — Z1231 Encounter for screening mammogram for malignant neoplasm of breast: Secondary | ICD-10-CM | POA: Insufficient documentation

## 2024-08-09 ENCOUNTER — Other Ambulatory Visit: Payer: Self-pay | Admitting: Emergency Medicine

## 2024-08-09 DIAGNOSIS — Z122 Encounter for screening for malignant neoplasm of respiratory organs: Secondary | ICD-10-CM

## 2024-08-09 DIAGNOSIS — F17210 Nicotine dependence, cigarettes, uncomplicated: Secondary | ICD-10-CM

## 2024-08-16 ENCOUNTER — Other Ambulatory Visit: Payer: Self-pay | Admitting: Family Medicine

## 2024-08-16 ENCOUNTER — Ambulatory Visit
Admission: RE | Admit: 2024-08-16 | Discharge: 2024-08-16 | Disposition: A | Discharge status: Home / Self Care | Source: Ambulatory Visit | Attending: Emergency Medicine | Admitting: Emergency Medicine

## 2024-08-16 DIAGNOSIS — Z122 Encounter for screening for malignant neoplasm of respiratory organs: Secondary | ICD-10-CM | POA: Insufficient documentation

## 2024-08-16 DIAGNOSIS — Z1231 Encounter for screening mammogram for malignant neoplasm of breast: Secondary | ICD-10-CM

## 2024-08-16 DIAGNOSIS — F17210 Nicotine dependence, cigarettes, uncomplicated: Secondary | ICD-10-CM | POA: Diagnosis present

## 2024-09-18 ENCOUNTER — Ambulatory Visit
Admission: RE | Admit: 2024-09-18 | Discharge: 2024-09-18 | Disposition: A | Discharge status: Home / Self Care | Source: Ambulatory Visit | Attending: Family Medicine | Admitting: Family Medicine

## 2024-09-18 DIAGNOSIS — Z1231 Encounter for screening mammogram for malignant neoplasm of breast: Secondary | ICD-10-CM | POA: Diagnosis present
